# Patient Record
Sex: Female | Born: 1935 | Race: White | Hispanic: No | Smoking: Never smoker
Health system: Southern US, Community
[De-identification: ages and names within clinical notes are randomized; demographics above are authoritative.]

## PROBLEM LIST (undated history)

## (undated) DIAGNOSIS — I10 Essential (primary) hypertension: Secondary | ICD-10-CM

## (undated) HISTORY — PX: ABDOMINAL HYSTERECTOMY: SHX81

---

## 2004-09-13 ENCOUNTER — Emergency Department: Payer: Self-pay | Admitting: Emergency Medicine

## 2004-09-24 ENCOUNTER — Ambulatory Visit: Payer: Self-pay | Admitting: Specialist

## 2004-10-08 ENCOUNTER — Ambulatory Visit: Payer: Self-pay | Admitting: Obstetrics & Gynecology

## 2004-10-11 ENCOUNTER — Ambulatory Visit: Payer: Self-pay | Admitting: Obstetrics & Gynecology

## 2004-10-21 ENCOUNTER — Inpatient Hospital Stay: Payer: Self-pay | Admitting: Surgery

## 2004-10-21 ENCOUNTER — Other Ambulatory Visit: Payer: Self-pay

## 2004-10-29 ENCOUNTER — Ambulatory Visit: Payer: Self-pay | Admitting: Surgery

## 2004-11-15 ENCOUNTER — Ambulatory Visit: Payer: Self-pay | Admitting: Surgery

## 2005-10-30 IMAGING — CT CT CHEST W/ CM
1 of 2 series · 15 of 32 positions shown, 19 images · IV contrast (APPLIED)
Comparison: none

REASON FOR EXAM: sob
COMMENTS:

[Series 6: inspace · axial · 0.72mm/px · z∈[-367,-59]mm · 15 of 485 slices shown, 19 images]
[im 23/485  mediastinal]
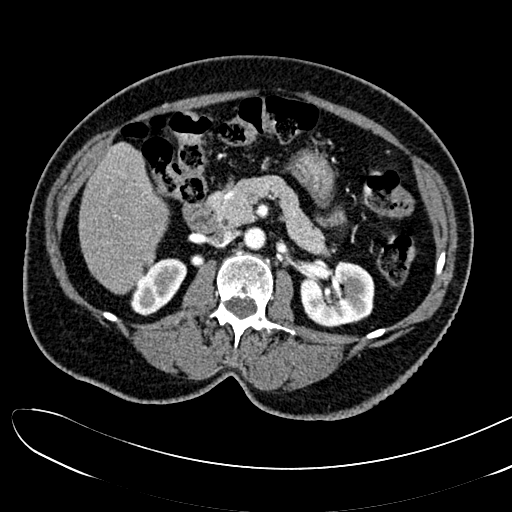
[im 23/485  lung]
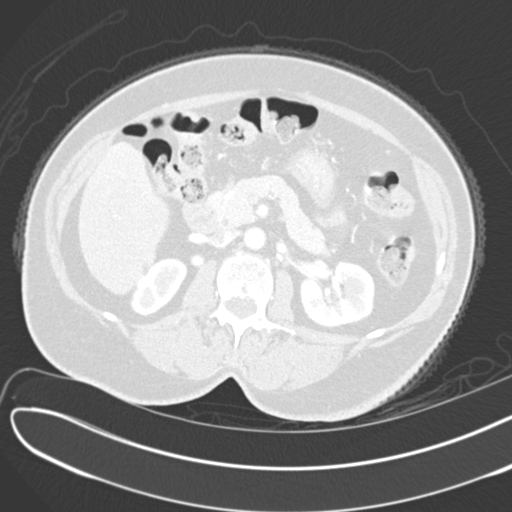
[im 67/485  lung]
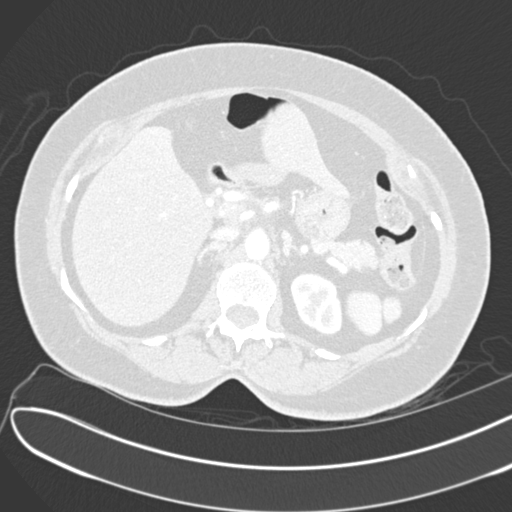
[im 111/485  lung]
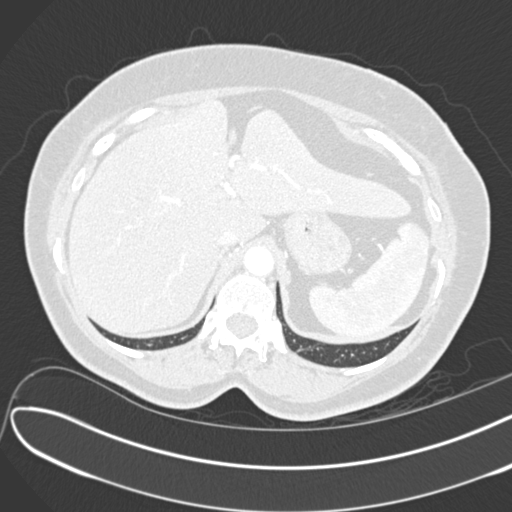
[im 122/485  lung]
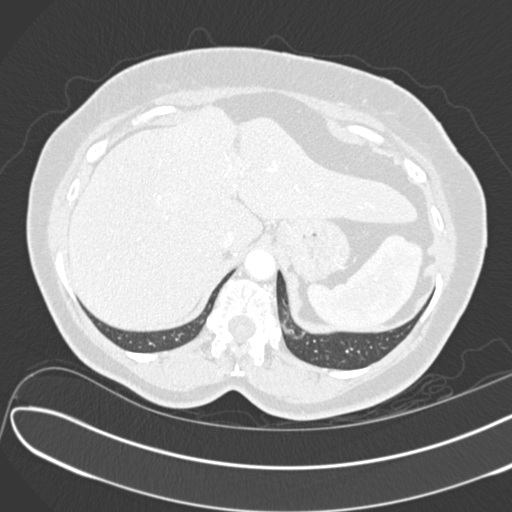
[im 155/485  mediastinal]
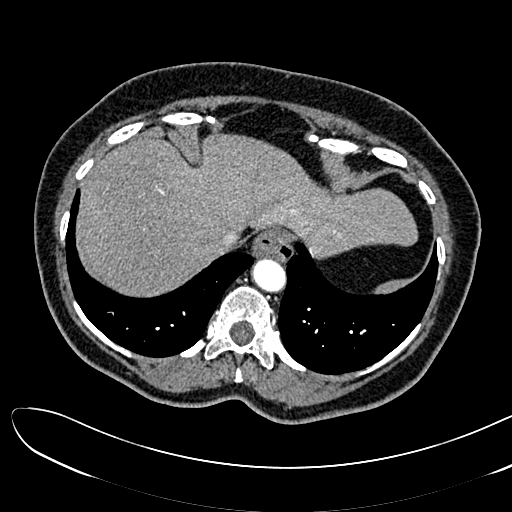
[im 155/485  lung]
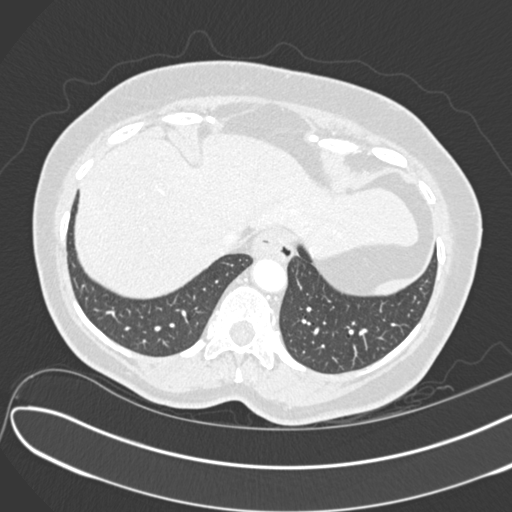
[im 177/485  lung]
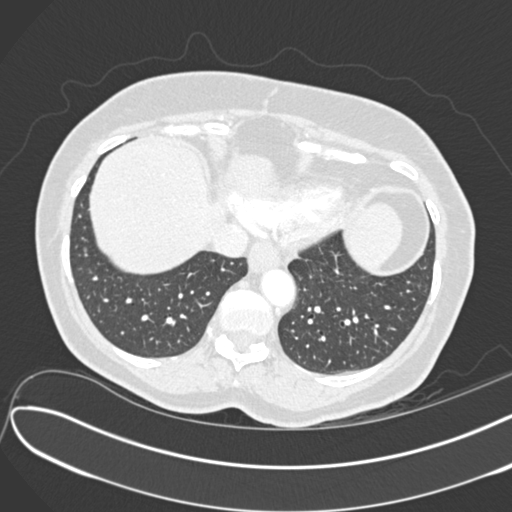
[im 221/485  lung]
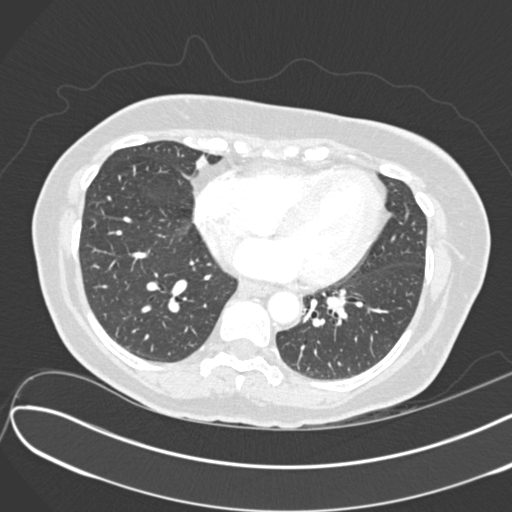
[im 243/485  lung]
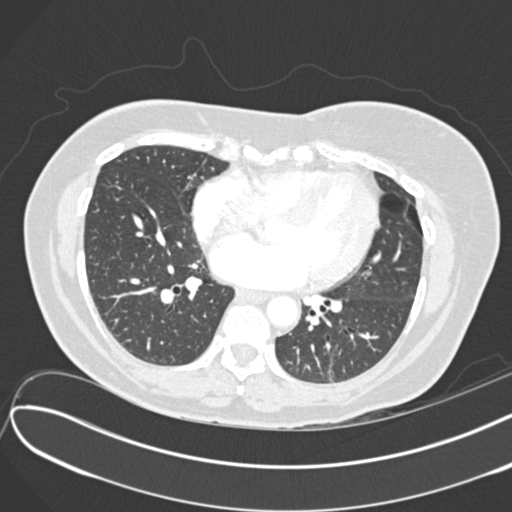
[im 265/485  mediastinal]
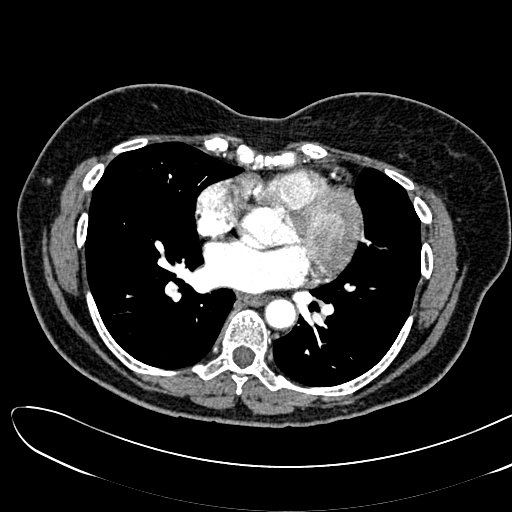
[im 265/485  lung]
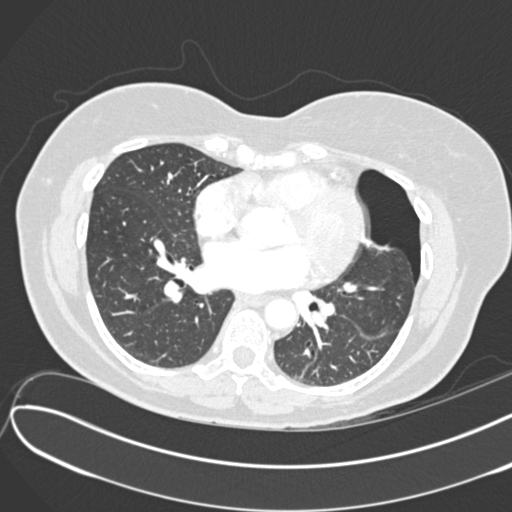
[im 309/485  lung]
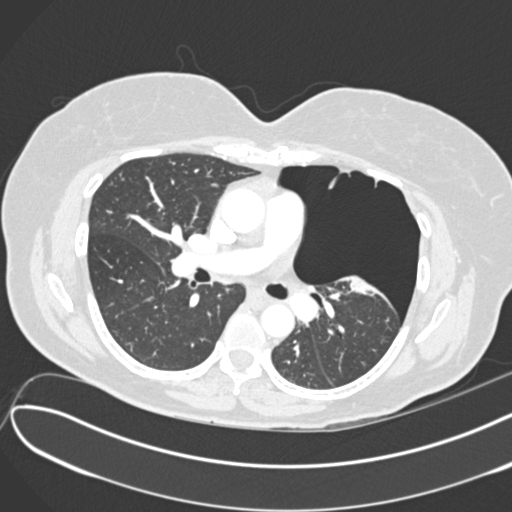
[im 331/485  lung]
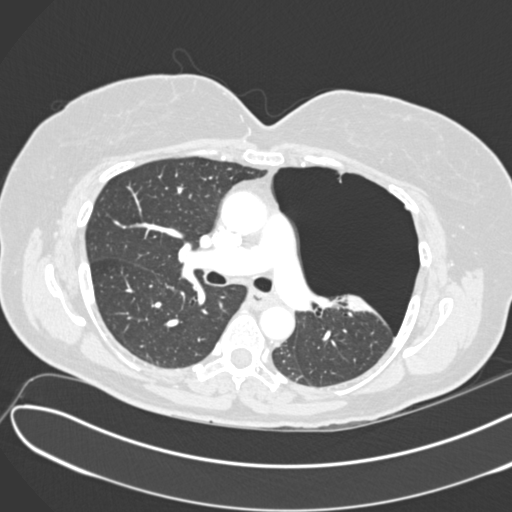
[im 364/485  lung]
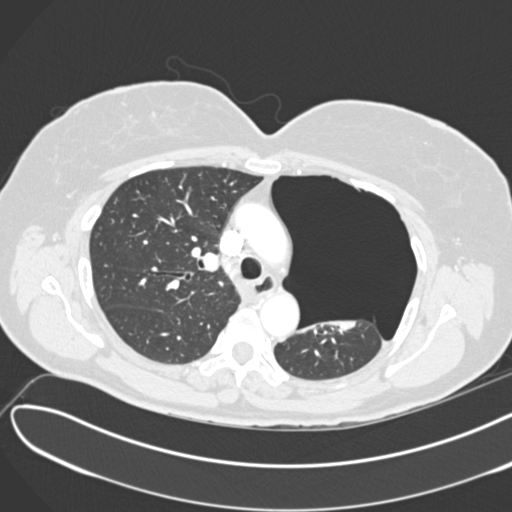
[im 397/485  mediastinal]
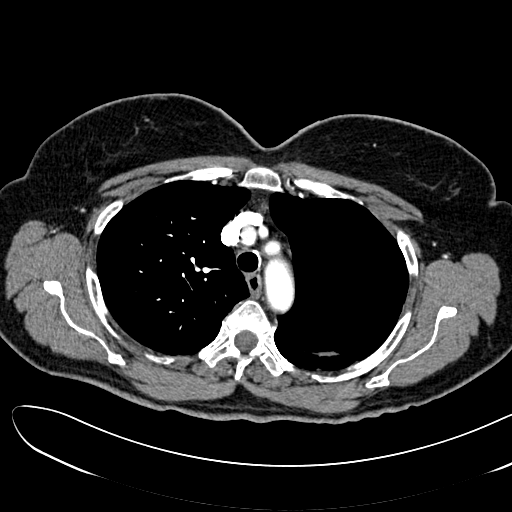
[im 397/485  lung]
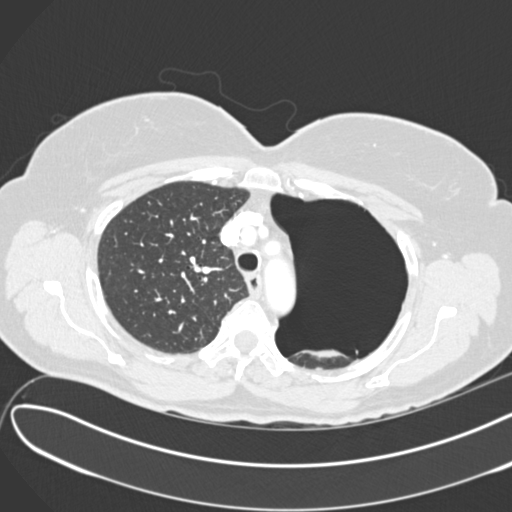
[im 419/485  lung]
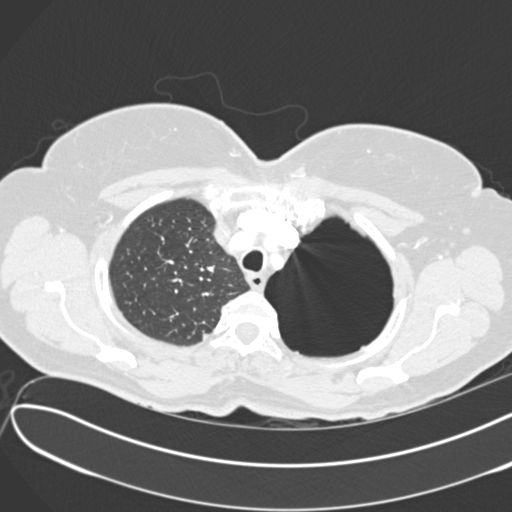
[im 463/485  lung]
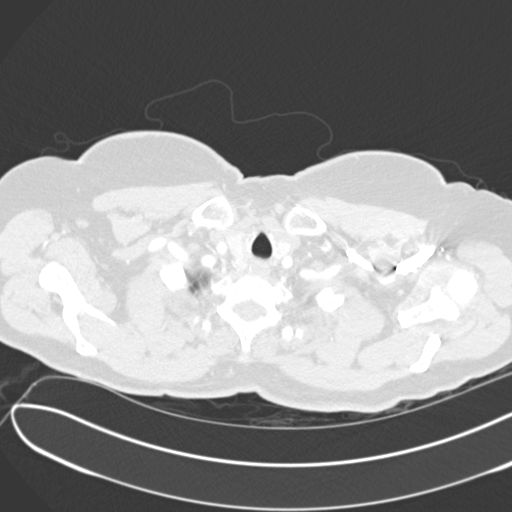

[15 of 32 positions shown; findings below may reference images not displayed]

PROCEDURE:     CT  - CT CHEST (FOR PE) W  - October 21, 2004  [DATE]

RESULT:     The patient is experiencing severe shortness of breath.  The
study was tailored to evaluate the patient for acute pulmonary embolism.
The patient received 100 cc of Isovue 370 for this study.

There is seen to be a very large LEFT pneumothorax.  This likely occupies at
least 60% of the lung volume.  The majority of the air is layering
anteriorly.  I do not see significant mediastinal shift currently.  No
pneumothorax on the RIGHT is seen.  I do not see definite evidence of
pneumomediastinum.

The caliber of the thoracic aorta is normal.  The appearance of the contrast
material within the pulmonary arterial tree is normal.  I do not see
objective evidence of an acute pulmonary embolism.  The cardiac chambers are
top normal in size.  I see no significant pleural fluid collection.  No
pathologic size mediastinal or hilar lymph nodes are seen.  Within the upper
abdomen the observed portions of the liver are normal.  The spleen is not
enlarged.  I see no adrenal masses.
IMPRESSION: 1.     There is a large LEFT pneumothorax occupying 50-60% of the lung
volume.  No significant mediastinal shift is seen.  I see no pleural
effusion.
2.     I see no evidence of an acute pulmonary embolism.
3.     I see no acute abnormality elsewhere.
4.     Not mentioned above is the fact that there is a small hiatal hernia.
5.     CV:  A preliminary report was faxed to the Emergency Room by Dr.
Jeypa Stiale Mojarrango of [REDACTED] at the conclusion of the study.

## 2006-03-23 ENCOUNTER — Emergency Department: Payer: Self-pay | Admitting: Emergency Medicine

## 2006-10-27 ENCOUNTER — Emergency Department: Payer: Self-pay | Admitting: Emergency Medicine

## 2006-10-27 ENCOUNTER — Other Ambulatory Visit: Payer: Self-pay

## 2017-11-25 ENCOUNTER — Other Ambulatory Visit: Payer: Self-pay

## 2017-11-25 ENCOUNTER — Encounter: Payer: Self-pay | Admitting: Emergency Medicine

## 2017-11-25 ENCOUNTER — Emergency Department
Admission: EM | Admit: 2017-11-25 | Discharge: 2017-11-25 | Disposition: A | Discharge status: Home / Self Care | Payer: Medicare Other | Attending: Emergency Medicine | Admitting: Emergency Medicine

## 2017-11-25 DIAGNOSIS — R05 Cough: Secondary | ICD-10-CM | POA: Insufficient documentation

## 2017-11-25 DIAGNOSIS — N390 Urinary tract infection, site not specified: Secondary | ICD-10-CM

## 2017-11-25 DIAGNOSIS — R6883 Chills (without fever): Secondary | ICD-10-CM | POA: Diagnosis present

## 2017-11-25 DIAGNOSIS — Z79899 Other long term (current) drug therapy: Secondary | ICD-10-CM | POA: Diagnosis not present

## 2017-11-25 DIAGNOSIS — I10 Essential (primary) hypertension: Secondary | ICD-10-CM | POA: Diagnosis not present

## 2017-11-25 HISTORY — DX: Essential (primary) hypertension: I10

## 2017-11-25 LAB — URINALYSIS, COMPLETE (UACMP) WITH MICROSCOPIC
BILIRUBIN URINE: NEGATIVE
Glucose, UA: NEGATIVE mg/dL
HGB URINE DIPSTICK: NEGATIVE
KETONES UR: NEGATIVE mg/dL
NITRITE: NEGATIVE
PROTEIN: NEGATIVE mg/dL
SPECIFIC GRAVITY, URINE: 1.009 (ref 1.005–1.030)
pH: 6 (ref 5.0–8.0)

## 2017-11-25 LAB — INFLUENZA PANEL BY PCR (TYPE A & B)
Influenza A By PCR: NEGATIVE
Influenza B By PCR: NEGATIVE

## 2017-11-25 MED ORDER — SULFAMETHOXAZOLE-TRIMETHOPRIM 800-160 MG PO TABS: 1.0000 | ORAL_TABLET | Freq: Two times a day (BID) | ORAL | 0 refills | Status: AC

## 2017-11-25 NOTE — ED Notes (Signed)
See triage note.  Per family she is visiting and has been exposed to flu   Cold sx's subjective fever afebrile on arrival

## 2017-11-25 NOTE — ED Provider Notes (Signed)
[facility] Emergency Department Provider Note  ____________________________________________   First MD Initiated Contact with Patient 11/25/17 1721     (approximate)  I have reviewed the triage vital signs and the nursing notes.   HISTORY  Chief Complaint No chief complaint on file.    HPI Sabrina Blackburn is a 82 y.o. female presents emergency department complaining of flulike symptoms.  She was in a car driving from [place] to here with her daughter who was recently diagnosed with flu.  Her other daughter that lives here is very concerned that due to her age that she may have contracted the flu.  She states she has had chills and a cough.  She denies any chest pain or shortness of breath.  She denies any burning with urination.  She states she has history of hypertension but is otherwise healthy   Past Medical History:  Diagnosis Date  . Hypertension     There are no active problems to display for this patient.   Past Surgical History:  Procedure Laterality Date  . ABDOMINAL HYSTERECTOMY      Prior to Admission medications   Medication Sig Start Date End Date Taking? Authorizing Provider  amLODipine (NORVASC) 10 MG tablet Take 10 mg by mouth daily.   Yes [provider]  lisinopril (PRINIVIL,ZESTRIL) 40 MG tablet Take 40 mg by mouth daily.   Yes [provider]  metoprolol succinate (TOPROL-XL) 50 MG 24 hr tablet Take 50 mg by mouth daily. Take with or immediately following a meal.   Yes [provider]  simvastatin (ZOCOR) 20 MG tablet Take 20 mg by mouth daily.   Yes [provider]  sulfamethoxazole-trimethoprim (BACTRIM DS,SEPTRA DS) 800-160 MG tablet Take 1 tablet by mouth 2 (two) times daily. 11/25/17   Faythe Ghee, PA-C    Allergies Codeine  No family history on file.  Social History Social History   Tobacco Use  . Smoking status: Never Smoker  . Smokeless tobacco: Never Used  Substance Use  Topics  . Alcohol use: Never    Frequency: Never  . Drug use: Not on file    Review of Systems  Constitutional: Positive fever/chills Eyes: No visual changes. ENT: No sore throat. Respiratory: Positive cough Genitourinary: Negative for dysuria. Musculoskeletal: Negative for back pain. Skin: Negative for rash.    ____________________________________________   PHYSICAL EXAM:  VITAL SIGNS: ED Triage Vitals  Enc Vitals Group     BP 11/25/17 1708 (!) 118/46     Pulse Rate 11/25/17 1708 (!) 57     Resp 11/25/17 1708 16     Temp 11/25/17 1708 98.1 F (36.7 C)     Temp Source 11/25/17 1708 Oral     SpO2 11/25/17 1708 98 %     Weight --      Height --      Head Circumference --      Peak Flow --      Pain Score 11/25/17 1707 0     Pain Loc --      Pain Edu? --      Excl. in GC? --     Constitutional: Alert and oriented. Well appearing and in no acute distress. Eyes: Conjunctivae are normal.  Head: Atraumatic. Nose: No congestion/rhinnorhea. Mouth/Throat: Mucous membranes are moist.   Cardiovascular: Normal rate, regular rhythm.  Heart sounds are normal, Respiratory: Normal respiratory effort.  No retractions, lungs are clear to auscultation Abdomen: Soft nontender GU: deferred Musculoskeletal: FROM all  extremities, warm and well perfused Neurologic:  Normal speech and language.  Skin:  Skin is warm, dry and intact. No rash noted. Psychiatric: Mood and affect are normal. Speech and behavior are normal.  ____________________________________________   LABS (all labs ordered are listed, but only abnormal results are displayed)  Labs Reviewed  URINALYSIS, COMPLETE (UACMP) WITH MICROSCOPIC - Abnormal; Notable for the following components:      Result Value   Color, Urine YELLOW (*)    APPearance HAZY (*)    Leukocytes, UA MODERATE (*)    Bacteria, UA RARE (*)    Squamous Epithelial / LPF 0-5 (*)    All other components within normal limits  URINE CULTURE    INFLUENZA PANEL BY PCR (TYPE A & B)   ____________________________________________   ____________________________________________  RADIOLOGY    ____________________________________________   PROCEDURES  Procedure(s) performed: No  Procedures    ____________________________________________   INITIAL IMPRESSION / ASSESSMENT AND PLAN / ED COURSE  Pertinent labs & imaging results that were available during my care of the patient were reviewed by me and considered in my medical decision making (see chart for details).  Patient is a 82 year old female presents emergency department with flulike symptoms.  She is with 2 family members.  On physical exam she appears very well and she is a "young" 82 year old female.  Lungs are clear to auscultation and abdomen is soft and nontender.  Flu test and UA are ordered   Flu test is negative.  The UA is positive for moderate leuks and bacteria.  Results were discussed with the patient and her family.  She was given a prescription for Septra DS 1 twice daily for 7 days.  She has had this medication before.  She states it works well.  If she is worsening she is to return to the emergency department.  She is to drink plenty of water to help clear the infection.  She was discharged in stable condition  As part of my medical decision making, I reviewed the following data within the electronic MEDICAL RECORD NUMBER Nursing notes reviewed and incorporated, Labs reviewed flu test is negative, UA is positive for moderate leuks and bacteria, Notes from prior ED visits and [place] Controlled Substance Database  ____________________________________________   FINAL CLINICAL IMPRESSION(S) / ED DIAGNOSES  Final diagnoses:  Urinary tract infection without hematuria, site unspecified      NEW MEDICATIONS STARTED DURING THIS VISIT:  Discharge Medication List as of 11/25/2017  6:32 PM    START taking these medications   Details   sulfamethoxazole-trimethoprim (BACTRIM DS,SEPTRA DS) 800-160 MG tablet Take 1 tablet by mouth 2 (two) times daily., Starting Tue 11/25/2017, Print         Note:  This document was prepared using Dragon voice recognition software and may include unintentional dictation errors.    Faythe Ghee, PA-C 11/25/17 2114    Phineas Semen, MD 11/25/17 2241

## 2017-11-25 NOTE — ED Triage Notes (Addendum)
Pt to ED via POV with family with c/o flu like symptoms with cough , congestion and fever. Recent exposure to family members with flu. VSS ,ambualtory

## 2017-11-27 LAB — URINE CULTURE

## 2017-11-28 ENCOUNTER — Encounter: Payer: Self-pay | Admitting: Emergency Medicine

## 2017-11-28 ENCOUNTER — Emergency Department: Payer: Medicare Other

## 2017-11-28 DIAGNOSIS — J40 Bronchitis, not specified as acute or chronic: Secondary | ICD-10-CM | POA: Insufficient documentation

## 2017-11-28 DIAGNOSIS — R0981 Nasal congestion: Secondary | ICD-10-CM | POA: Diagnosis present

## 2017-11-28 DIAGNOSIS — Z79899 Other long term (current) drug therapy: Secondary | ICD-10-CM | POA: Diagnosis not present

## 2017-11-28 DIAGNOSIS — R05 Cough: Secondary | ICD-10-CM | POA: Diagnosis not present

## 2017-11-28 DIAGNOSIS — I10 Essential (primary) hypertension: Secondary | ICD-10-CM | POA: Insufficient documentation

## 2017-11-28 NOTE — ED Notes (Signed)
Family to stat desk asking about wait time. Family given an update. Family verbalizes understanding.

## 2017-11-28 NOTE — ED Triage Notes (Signed)
Patient with complaint of chest congestion that started 5 days ago. Patient was seen here for the chest congestion 3 days ago. Patient was told that if it got worse then to come back.

## 2017-11-29 ENCOUNTER — Emergency Department
Admission: EM | Admit: 2017-11-29 | Discharge: 2017-11-29 | Disposition: A | Discharge status: Home / Self Care | Payer: Medicare Other | Attending: Emergency Medicine | Admitting: Emergency Medicine

## 2017-11-29 DIAGNOSIS — J40 Bronchitis, not specified as acute or chronic: Secondary | ICD-10-CM

## 2017-11-29 MED ORDER — PREDNISONE 20 MG PO TABS: 40.0000 mg | ORAL_TABLET | Freq: Every day | ORAL | 0 refills | Status: AC

## 2017-11-29 MED ORDER — PREDNISONE 20 MG PO TABS
40.0000 mg | ORAL_TABLET | Freq: Once | ORAL | Status: AC
  Administered 2017-11-29: 40 mg via ORAL
  Filled 2017-11-29: qty 2

## 2017-11-29 MED ORDER — ALBUTEROL SULFATE HFA 108 (90 BASE) MCG/ACT IN AERS: 2.0000 | INHALATION_SPRAY | Freq: Four times a day (QID) | RESPIRATORY_TRACT | 0 refills | Status: AC | PRN

## 2017-11-29 MED ORDER — ALBUTEROL SULFATE (2.5 MG/3ML) 0.083% IN NEBU
2.5000 mg | INHALATION_SOLUTION | Freq: Once | RESPIRATORY_TRACT | Status: AC
  Administered 2017-11-29: 2.5 mg via RESPIRATORY_TRACT
  Filled 2017-11-29: qty 3

## 2017-11-29 NOTE — ED Provider Notes (Signed)
[facility] Emergency Department Provider Note  ____________________________________________   First MD Initiated Contact with Patient 11/29/17 0006     (approximate)  I have reviewed the triage vital signs and the nursing notes.   HISTORY  Chief Complaint Nasal Congestion   HPI Sabrina Blackburn is a 82 y.o. female with a history of hypertension was presenting with 3 days of cough as well as congestion.  She said that she was also diagnosed with a UTI on the 26 and congestion at that time but was not given any treatment.  Says the congestion is gotten worse and the son enlarged reports "wheezing" which she describes as upper airway rattling as well as rattling in the chest.  Patient is denying any pain.  Denies any fever.     Past Medical History:  Diagnosis Date  . Hypertension     There are no active problems to display for this patient.   Past Surgical History:  Procedure Laterality Date  . ABDOMINAL HYSTERECTOMY      Prior to Admission medications   Medication Sig Start Date End Date Taking? Authorizing Provider  amLODipine (NORVASC) 10 MG tablet Take 10 mg by mouth daily.    [provider]  lisinopril (PRINIVIL,ZESTRIL) 40 MG tablet Take 40 mg by mouth daily.    [provider]  metoprolol succinate (TOPROL-XL) 50 MG 24 hr tablet Take 50 mg by mouth daily. Take with or immediately following a meal.    [provider]  simvastatin (ZOCOR) 20 MG tablet Take 20 mg by mouth daily.    [provider]  sulfamethoxazole-trimethoprim (BACTRIM DS,SEPTRA DS) 800-160 MG tablet Take 1 tablet by mouth 2 (two) times daily. 11/25/17   Faythe GheeFisher, Susan W, PA-C    Allergies Codeine  No family history on file.  Social History Social History   Tobacco Use  . Smoking status: Never Smoker  . Smokeless tobacco: Never Used  Substance Use Topics  . Alcohol use: Never    Frequency: Never  . Drug use: Not on file    Review  of Systems  Constitutional: No fever/chills Eyes: No visual changes. ENT: No sore throat. Cardiovascular: Denies chest pain. Respiratory: As above Gastrointestinal: No abdominal pain.  No nausea, no vomiting.  No diarrhea.  No constipation. Genitourinary: Negative for dysuria. Musculoskeletal: Negative for back pain. Skin: Negative for rash. Neurological: Negative for headaches, focal weakness or numbness.   ____________________________________________   PHYSICAL EXAM:  VITAL SIGNS: ED Triage Vitals  Enc Vitals Group     BP 11/28/17 2129 (!) 128/55     Pulse Rate 11/28/17 2129 72     Resp --      Temp 11/28/17 2129 98.8 F (37.1 C)     Temp Source 11/28/17 2129 Oral     SpO2 11/28/17 2129 96 %     Weight 11/28/17 2130 130 lb (59 kg)     Height 11/28/17 2130 5\' 3"  (1.6 m)     Head Circumference --      Peak Flow --      Pain Score 11/28/17 2140 0     Pain Loc --      Pain Edu? --      Excl. in GC? --     Constitutional: Alert and oriented. Well appearing and in no acute distress. Eyes: Conjunctivae are normal.  Head: Atraumatic. Nose: No congestion/rhinnorhea. Mouth/Throat: Mucous membranes are moist.  Neck: No stridor.   Cardiovascular: Normal rate, regular rhythm. Grossly  normal heart sounds.   Respiratory: Normal respiratory effort.  No retractions.  Mild to moderate wheezing throughout with a prolonged expiratory phase and slightly decreased air movement. Gastrointestinal: Soft and nontender. No distention. No CVA tenderness. Musculoskeletal: No lower extremity tenderness nor edema.  No joint effusions. Neurologic:  Normal speech and language. No gross focal neurologic deficits are appreciated. Skin:  Skin is warm, dry and intact. No rash noted. Psychiatric: Mood and affect are normal. Speech and behavior are normal.  ____________________________________________   LABS (all labs ordered are listed, but only abnormal results are displayed)  Labs Reviewed -  No data to display ____________________________________________  EKG  ED ECG REPORT I, Sabrina Blackburn, the attending physician, personally viewed and interpreted this ECG.   Date: 11/29/2017  EKG Time: 2134  Rate: 74  Rhythm: normal sinus rhythm with PACs.  Axis: Normal  Intervals:none  ST&T Change: No ST segment elevation or depression.  No abnormal T wave inversion.  ____________________________________________  RADIOLOGY  Scarring but without any consolidation ____________________________________________   PROCEDURES  Procedure(s) performed:   Procedures  Critical Care performed:   ____________________________________________   INITIAL IMPRESSION / ASSESSMENT AND PLAN / ED COURSE  Pertinent labs & imaging results that were available during my care of the patient were reviewed by me and considered in my medical decision making (see chart for details).  Differential includes, but is not limited to, viral syndrome, bronchitis including COPD exacerbation, pneumonia, reactive airway disease including asthma, CHF including exacerbation with or without pulmonary/interstitial edema, pneumothorax, ACS, thoracic trauma, and pulmonary embolism. As part of my medical decision making, I reviewed the following data within the electronic MEDICAL RECORD NUMBER Notes from prior office visits   ----------------------------------------- 2:34 AM on 11/29/2017 -----------------------------------------  Patient at this time with improved air movement.  Still wheezing but says that she does not feel short of breath and feels much improved.  Patient will be discharged with inhaler as well as steroids over the next 5 days.  We will continue Bactrim for UTI.  She is understanding of the plan and willing to comply. ____________________________________________   FINAL CLINICAL IMPRESSION(S) / ED DIAGNOSES  Bronchitis.   NEW MEDICATIONS STARTED DURING THIS VISIT:  New Prescriptions    No medications on file     Note:  This document was prepared using Dragon voice recognition software and may include unintentional dictation errors.     Myrna Blazer, MD 11/29/17 [phone]

## 2017-11-29 NOTE — ED Notes (Signed)
ED Provider at bedside. 

## 2018-12-07 IMAGING — CR DG CHEST 2V
2 series · 2 of 2 positions shown · non-contrast
Comparison: Radiographs October 27, 2006.

CLINICAL DATA: Chest congestion.

EXAM:
CHEST - 2 VIEW

[chest pa]
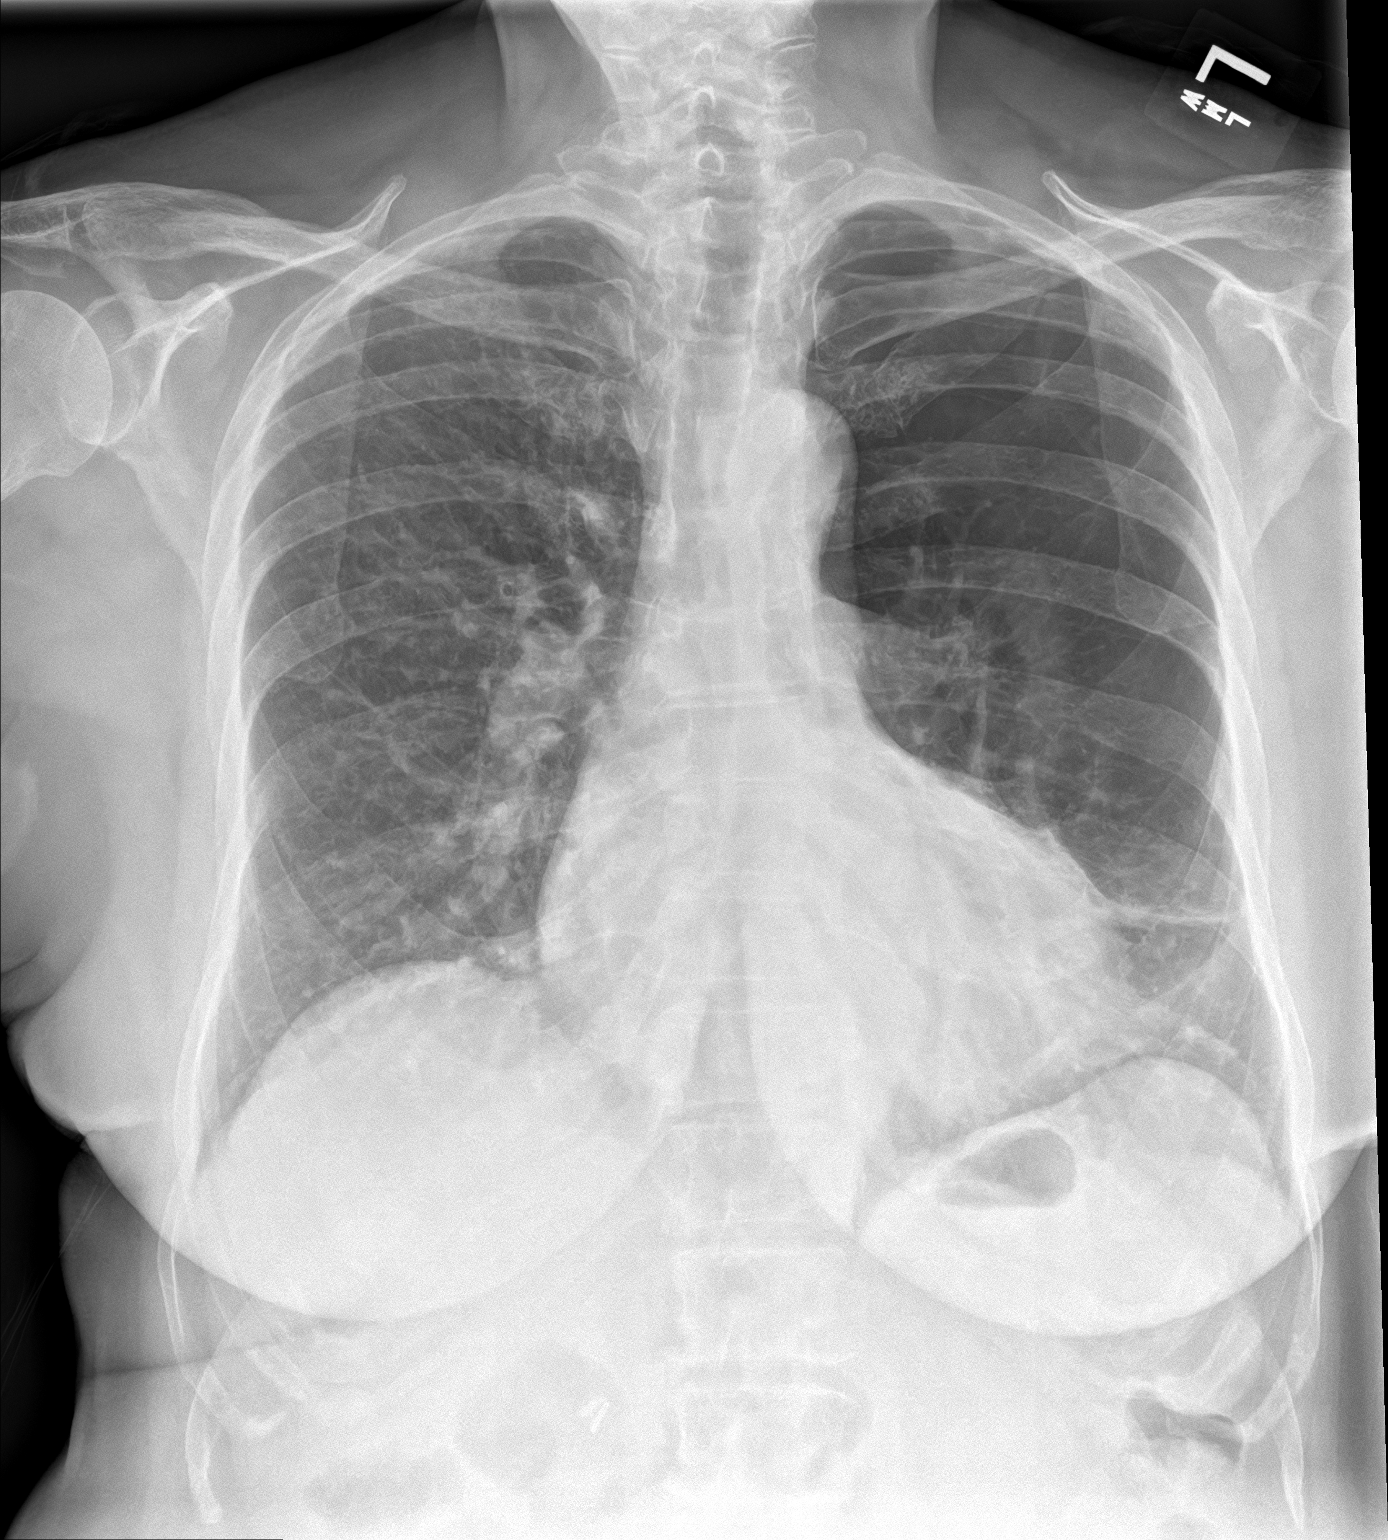

[chest lat]
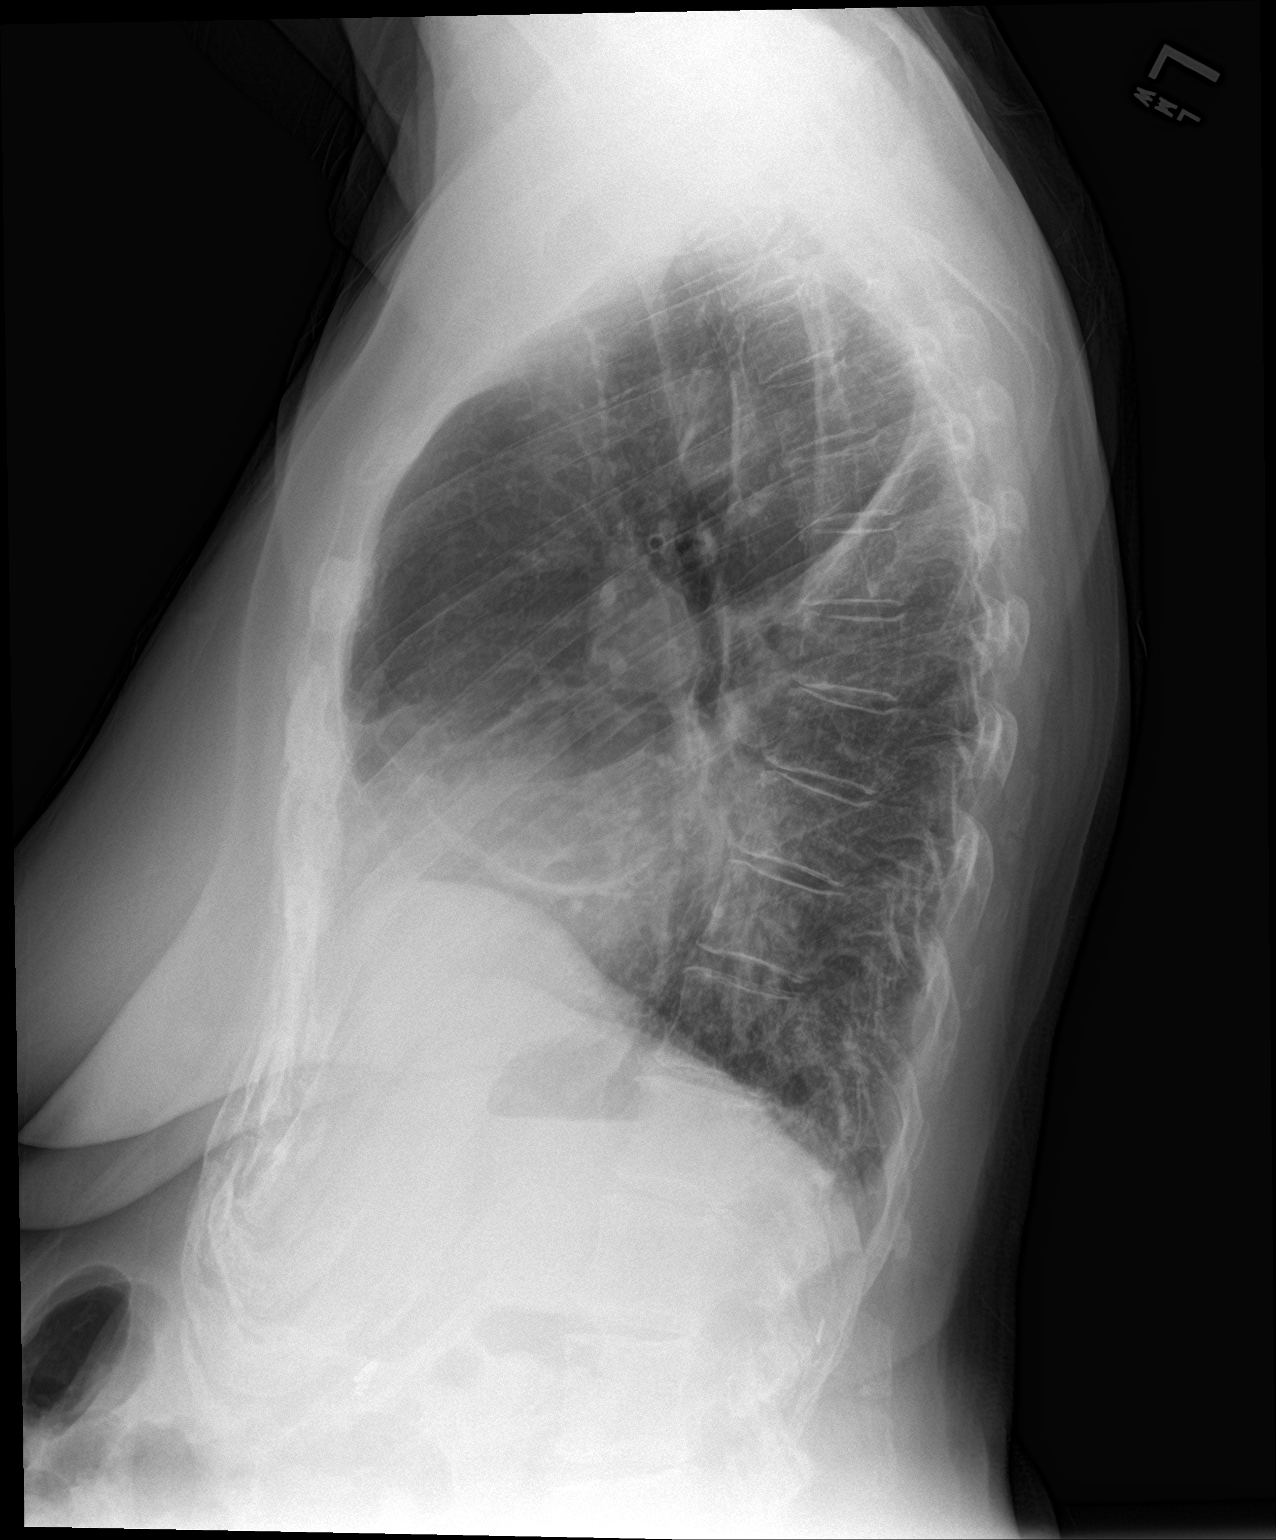

[2 of 2 positions shown; findings below may reference images not displayed]

FINDINGS: Stable cardiomediastinal silhouette. Emphysematous disease is noted
in the left upper lobe. Mild subsegmental atelectasis or scarring is
seen in the left lung base. Right lung is clear. No pneumothorax or
pleural effusion is noted. Bony thorax is unremarkable.
IMPRESSION: Mild left basilar subsegmental atelectasis or scarring.

Emphysema ([id number]).
